# Patient Record
Sex: Male | Born: 2000 | Hispanic: No | Marital: Single | Smoking: Never smoker
Health system: Southern US, Community
[De-identification: ages and names within clinical notes are randomized; demographics above are authoritative.]

## PROBLEM LIST (undated history)

## (undated) HISTORY — PX: WISDOM TOOTH EXTRACTION: SHX21

---
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
## 2018-05-01 ENCOUNTER — Ambulatory Visit: Payer: Self-pay | Admitting: Family Medicine

## 2018-05-01 DIAGNOSIS — J40 Bronchitis, not specified as acute or chronic: Secondary | ICD-10-CM

## 2018-05-01 DIAGNOSIS — J329 Chronic sinusitis, unspecified: Secondary | ICD-10-CM

## 2018-05-01 MED ORDER — PSEUDOEPH-BROMPHEN-DM 30-2-10 MG/5ML PO SYRP: 10.0000 mL | ORAL_SOLUTION | Freq: Three times a day (TID) | ORAL | 0 refills | Status: AC | PRN

## 2018-05-01 MED ORDER — AZELASTINE HCL 0.1 % NA SOLN: 1.0000 | Freq: Two times a day (BID) | NASAL | 0 refills | Status: AC

## 2018-05-01 MED ORDER — AMOXICILLIN-POT CLAVULANATE 875-125 MG PO TABS: 1.0000 | ORAL_TABLET | Freq: Two times a day (BID) | ORAL | 0 refills | Status: AC

## 2018-05-01 NOTE — Progress Notes (Signed)
Samuel Dunlap is a 17 y.o. male who presents today with concerns of sinus congestion and pain after being exposed to college roommates who were both sick. He began having symptoms and productive cough.  Review of Systems  Constitutional: Negative for chills, fever and malaise/fatigue.  HENT: Negative for congestion, ear discharge, ear pain, sinus pain and sore throat.   Eyes: Negative.   Respiratory: Negative for cough, sputum production and shortness of breath.   Cardiovascular: Negative.  Negative for chest pain.  Gastrointestinal: Negative for abdominal pain, diarrhea, nausea and vomiting.  Genitourinary: Negative for dysuria, frequency, hematuria and urgency.  Musculoskeletal: Negative for myalgias.  Skin: Negative.   Neurological: Negative for headaches.  Endo/Heme/Allergies: Negative.   Psychiatric/Behavioral: Negative.     O: Vitals:   05/01/18 1354  BP: 118/78  Pulse: 64  Temp: 98.1 F (36.7 C)  SpO2: 95%     Physical Exam  Constitutional: He is oriented to person, place, and time. Vital signs are normal. He appears well-developed and well-nourished. He is active.  Non-toxic appearance. He does not have a sickly appearance.  HENT:  Head: Normocephalic.  Right Ear: Hearing, tympanic membrane, external ear and ear canal normal.  Left Ear: Hearing, tympanic membrane, external ear and ear canal normal.  Nose: Rhinorrhea present. Right sinus exhibits maxillary sinus tenderness. Right sinus exhibits no frontal sinus tenderness. Left sinus exhibits maxillary sinus tenderness. Left sinus exhibits no frontal sinus tenderness.  Mouth/Throat: Uvula is midline and oropharynx is clear and moist.  Neck: Normal range of motion. Neck supple.  Cardiovascular: Normal rate, regular rhythm, normal heart sounds and normal pulses.  Pulmonary/Chest: Effort normal. He has rales in the right upper field, the right middle field, the left upper field and the left middle field.  Abdominal:  Soft. Bowel sounds are normal.  Musculoskeletal: Normal range of motion.  Lymphadenopathy:       Head (right side): No submental and no submandibular adenopathy present.       Head (left side): No submental and no submandibular adenopathy present.    He has no cervical adenopathy.  Neurological: He is alert and oriented to person, place, and time.  Psychiatric: He has a normal mood and affect.  Vitals reviewed.  A: 1. Bronchitis    P: Discussed exam findings, diagnosis etiology and medication use and indications reviewed with patient. Follow- Up and discharge instructions provided. No emergent/urgent issues found on exam.  Patient verbalized understanding of information provided and agrees with plan of care (POC), all questions answered.  1. Bronchitis - amoxicillin-clavulanate (AUGMENTIN) 875-125 MG tablet; Take 1 tablet by mouth 2 (two) times daily for 7 days. - brompheniramine-pseudoephedrine-DM 30-2-10 MG/5ML syrup; Take 10 mLs by mouth 3 (three) times daily as needed. - azelastine (ASTELIN) 0.1 % nasal spray; Place 1 spray into both nostrils 2 (two) times daily. Use in each nostril as directed

## 2018-05-01 NOTE — Patient Instructions (Signed)
Acute Bronchitis, Adult Acute bronchitis is when air tubes (bronchi) in the lungs suddenly get swollen. The condition can make it hard to breathe. It can also cause these symptoms:  A cough.  Coughing up clear, yellow, or green mucus.  Wheezing.  Chest congestion.  Shortness of breath.  A fever.  Body aches.  Chills.  A sore throat.  Follow these instructions at home: Medicines  Take over-the-counter and prescription medicines only as told by your doctor.  If you were prescribed an antibiotic medicine, take it as told by your doctor. Do not stop taking the antibiotic even if you start to feel better. General instructions  Rest.  Drink enough fluids to keep your pee (urine) clear or pale yellow.  Avoid smoking and secondhand smoke. If you smoke and you need help quitting, ask your doctor. Quitting will help your lungs heal faster.  Use an inhaler, cool mist vaporizer, or humidifier as told by your doctor.  Keep all follow-up visits as told by your doctor. This is important. How is this prevented? To lower your risk of getting this condition again:  Wash your hands often with soap and water. If you cannot use soap and water, use hand sanitizer.  Avoid contact with people who have cold symptoms.  Try not to touch your hands to your mouth, nose, or eyes.  Make sure to get the flu shot every year.  Contact a doctor if:  Your symptoms do not get better in 2 weeks. Get help right away if:  You cough up blood.  You have chest pain.  You have very bad shortness of breath.  You become dehydrated.  You faint (pass out) or keep feeling like you are going to pass out.  You keep throwing up (vomiting).  You have a very bad headache.  Your fever or chills gets worse. This information is not intended to replace advice given to you by your health care provider. Make sure you discuss any questions you have with your health care provider. Document Released:  12/16/2007 Document Revised: 02/05/2016 Document Reviewed: 12/18/2015 Elsevier Interactive Patient Education  2018 [employer]. Food Choices to Help Relieve Diarrhea, Adult When you have diarrhea, the foods you eat and your eating habits are very important. Choosing the right foods and drinks can help:  Relieve diarrhea.  Replace lost fluids and nutrients.  Prevent dehydration.  What general guidelines should I follow? Relieving diarrhea  Choose foods with less than 2 g or .07 oz. of fiber per serving.  Limit fats to less than 8 tsp (38 g or 1.34 oz.) a day.  Avoid the following: ? Foods and beverages sweetened with high-fructose corn syrup, honey, or sugar alcohols such as xylitol, sorbitol, and mannitol. ? Foods that contain a lot of fat or sugar. ? Fried, greasy, or spicy foods. ? High-fiber grains, breads, and cereals. ? Raw fruits and vegetables.  Eat foods that are rich in probiotics. These foods include dairy products such as yogurt and fermented milk products. They help increase healthy bacteria in the stomach and intestines (gastrointestinal tract, or GI tract).  If you have lactose intolerance, avoid dairy products. These may make your diarrhea worse.  Take medicine to help stop diarrhea (antidiarrheal medicine) only as told by your health care provider. Replacing nutrients  Eat small meals or snacks every 3-4 hours.  Eat bland foods, such as white rice, toast, or baked potato, until your diarrhea starts to get better. Gradually reintroduce nutrient-rich foods as tolerated or  as told by your health care provider. This includes: ? Well-cooked protein foods. ? Peeled, seeded, and soft-cooked fruits and vegetables. ? Low-fat dairy products.  Take vitamin and mineral supplements as told by your health care provider. Preventing dehydration   Start by sipping water or a special solution to prevent dehydration (oral rehydration solution, ORS). Urine that is clear or  pale yellow means that you are getting enough fluid.  Try to drink at least 8-10 cups of fluid each day to help replace lost fluids.  You may add other liquids in addition to water, such as clear juice or decaffeinated sports drinks, as tolerated or as told by your health care provider.  Avoid drinks with caffeine, such as coffee, tea, or soft drinks.  Avoid alcohol. What foods are recommended? The items listed may not be a complete list. Talk with your health care provider about what dietary choices are best for you. Grains White rice. White, [place], or pita breads (fresh or toasted), including plain rolls, buns, or bagels. White pasta. Saltine, soda, or Zidan Helget crackers. Pretzels. Low-fiber cereal. Cooked cereals made with water (such as cornmeal, farina, or cream cereals). Plain muffins. Matzo. Melba toast. Zwieback. Vegetables Potatoes (without the skin). Most well-cooked and canned vegetables without skins or seeds. Tender lettuce. Fruits Apple sauce. Fruits canned in juice. Cooked apricots, cherries, grapefruit, peaches, pears, or plums. Fresh bananas and cantaloupe. Meats and other protein foods Baked or boiled chicken. Eggs. Tofu. Fish. Seafood. Smooth nut butters. Ground or well-cooked tender beef, ham, veal, lamb, pork, or poultry. Dairy Plain yogurt, kefir, and unsweetened liquid yogurt. Lactose-free milk, buttermilk, skim milk, or soy milk. Low-fat or nonfat hard cheese. Beverages Water. Low-calorie sports drinks. Fruit juices without pulp. Strained tomato and vegetable juices. Decaffeinated teas. Sugar-free beverages not sweetened with sugar alcohols. Oral rehydration solutions, if approved by your health care provider. Seasoning and other foods Bouillon, broth, or soups made from recommended foods. What foods are not recommended? The items listed may not be a complete list. Talk with your health care provider about what dietary choices are best for you. Grains Whole grain,  whole wheat, bran, or rye breads, rolls, pastas, and crackers. Wild or brown rice. Whole grain or bran cereals. Barley. Oats and oatmeal. Corn tortillas or taco shells. Granola. Popcorn. Vegetables Raw vegetables. Fried vegetables. Cabbage, broccoli, Brussels sprouts, artichokes, baked beans, beet greens, corn, kale, legumes, peas, sweet potatoes, and yams. Potato skins. Cooked spinach and cabbage. Fruits Dried fruit, including raisins and dates. Raw fruits. Stewed or dried prunes. Canned fruits with syrup. Meat and other protein foods Fried or fatty meats. Deli meats. Chunky nut butters. Nuts and seeds. Beans and lentils. Tomasa Blase. Hot dogs. Sausage. Dairy High-fat cheeses. Whole milk, chocolate milk, and beverages made with milk, such as milk shakes. Half-and-half. Cream. sour cream. Ice cream. Beverages Caffeinated beverages (such as coffee, tea, soda, or energy drinks). Alcoholic beverages. Fruit juices with pulp. Prune juice. Soft drinks sweetened with high-fructose corn syrup or sugar alcohols. High-calorie sports drinks. Fats and oils Butter. Cream sauces. Margarine. Salad oils. Plain salad dressings. Olives. Avocados. Mayonnaise. Sweets and desserts Sweet rolls, doughnuts, and sweet breads. Sugar-free desserts sweetened with sugar alcohols such as xylitol and sorbitol. Seasoning and other foods Honey. Hot sauce. Chili powder. Gravy. Cream-based or milk-based soups. Pancakes and waffles. Summary  When you have diarrhea, the foods you eat and your eating habits are very important.  Make sure you get at least 8-10 cups of fluid each day, or  enough to keep your urine clear or pale yellow.  Eat bland foods and gradually reintroduce healthy, nutrient-rich foods as tolerated, or as told by your health care provider.  Avoid high-fiber, fried, greasy, or spicy foods. This information is not intended to replace advice given to you by your health care provider. Make sure you discuss any questions  you have with your health care provider. Document Released: 09/19/2003 Document Revised: 06/26/2016 Document Reviewed: 06/26/2016 Elsevier Interactive Patient Education  [employer].

## 2018-07-22 DIAGNOSIS — S99911A Unspecified injury of right ankle, initial encounter: Secondary | ICD-10-CM | POA: Diagnosis not present

## 2018-12-23 DIAGNOSIS — Z20828 Contact with and (suspected) exposure to other viral communicable diseases: Secondary | ICD-10-CM | POA: Diagnosis not present

## 2019-09-21 DIAGNOSIS — Z20828 Contact with and (suspected) exposure to other viral communicable diseases: Secondary | ICD-10-CM | POA: Diagnosis not present

## 2019-09-22 DIAGNOSIS — Z03818 Encounter for observation for suspected exposure to other biological agents ruled out: Secondary | ICD-10-CM | POA: Diagnosis not present

## 2019-09-22 DIAGNOSIS — Z20828 Contact with and (suspected) exposure to other viral communicable diseases: Secondary | ICD-10-CM | POA: Diagnosis not present

## 2019-09-24 DIAGNOSIS — Z23 Encounter for immunization: Secondary | ICD-10-CM | POA: Diagnosis not present

## 2019-10-22 DIAGNOSIS — Z23 Encounter for immunization: Secondary | ICD-10-CM | POA: Diagnosis not present

## 2020-01-01 DIAGNOSIS — H5213 Myopia, bilateral: Secondary | ICD-10-CM | POA: Diagnosis not present

## 2020-01-26 DIAGNOSIS — S8991XA Unspecified injury of right lower leg, initial encounter: Secondary | ICD-10-CM | POA: Diagnosis not present

## 2020-01-26 DIAGNOSIS — S83411A Sprain of medial collateral ligament of right knee, initial encounter: Secondary | ICD-10-CM | POA: Diagnosis not present

## 2020-01-26 DIAGNOSIS — S83421A Sprain of lateral collateral ligament of right knee, initial encounter: Secondary | ICD-10-CM | POA: Diagnosis not present

## 2020-02-20 DIAGNOSIS — S8991XA Unspecified injury of right lower leg, initial encounter: Secondary | ICD-10-CM | POA: Diagnosis not present

## 2020-02-23 DIAGNOSIS — S8991XA Unspecified injury of right lower leg, initial encounter: Secondary | ICD-10-CM | POA: Diagnosis not present

## 2020-02-27 DIAGNOSIS — S8991XA Unspecified injury of right lower leg, initial encounter: Secondary | ICD-10-CM | POA: Diagnosis not present

## 2020-03-05 DIAGNOSIS — S8991XA Unspecified injury of right lower leg, initial encounter: Secondary | ICD-10-CM | POA: Diagnosis not present

## 2020-03-12 DIAGNOSIS — S8991XA Unspecified injury of right lower leg, initial encounter: Secondary | ICD-10-CM | POA: Diagnosis not present

## 2020-03-15 DIAGNOSIS — J029 Acute pharyngitis, unspecified: Secondary | ICD-10-CM | POA: Diagnosis not present

## 2020-03-15 DIAGNOSIS — S8991XA Unspecified injury of right lower leg, initial encounter: Secondary | ICD-10-CM | POA: Diagnosis not present

## 2020-03-20 DIAGNOSIS — S8991XA Unspecified injury of right lower leg, initial encounter: Secondary | ICD-10-CM | POA: Diagnosis not present

## 2020-03-21 DIAGNOSIS — S83411A Sprain of medial collateral ligament of right knee, initial encounter: Secondary | ICD-10-CM | POA: Diagnosis not present

## 2020-03-21 DIAGNOSIS — S83241A Other tear of medial meniscus, current injury, right knee, initial encounter: Secondary | ICD-10-CM | POA: Diagnosis not present

## 2020-03-21 DIAGNOSIS — S83511A Sprain of anterior cruciate ligament of right knee, initial encounter: Secondary | ICD-10-CM | POA: Diagnosis not present

## 2020-04-14 DIAGNOSIS — Z03818 Encounter for observation for suspected exposure to other biological agents ruled out: Secondary | ICD-10-CM | POA: Diagnosis not present

## 2020-05-08 DIAGNOSIS — S83511D Sprain of anterior cruciate ligament of right knee, subsequent encounter: Secondary | ICD-10-CM | POA: Diagnosis not present

## 2020-06-21 DIAGNOSIS — Z20822 Contact with and (suspected) exposure to covid-19: Secondary | ICD-10-CM | POA: Diagnosis not present

## 2020-06-21 DIAGNOSIS — Z01812 Encounter for preprocedural laboratory examination: Secondary | ICD-10-CM | POA: Diagnosis not present

## 2020-06-24 DIAGNOSIS — Z6371 Stress on family due to return of family member from military deployment: Secondary | ICD-10-CM | POA: Diagnosis not present

## 2020-06-24 DIAGNOSIS — S83281A Other tear of lateral meniscus, current injury, right knee, initial encounter: Secondary | ICD-10-CM | POA: Diagnosis not present

## 2020-06-24 DIAGNOSIS — Z7409 Other reduced mobility: Secondary | ICD-10-CM | POA: Diagnosis not present

## 2020-06-24 DIAGNOSIS — S83511A Sprain of anterior cruciate ligament of right knee, initial encounter: Secondary | ICD-10-CM | POA: Diagnosis not present

## 2020-06-24 DIAGNOSIS — S83241A Other tear of medial meniscus, current injury, right knee, initial encounter: Secondary | ICD-10-CM | POA: Diagnosis not present

## 2020-06-28 DIAGNOSIS — S83511D Sprain of anterior cruciate ligament of right knee, subsequent encounter: Secondary | ICD-10-CM | POA: Diagnosis not present

## 2020-06-28 DIAGNOSIS — M25661 Stiffness of right knee, not elsewhere classified: Secondary | ICD-10-CM | POA: Diagnosis not present

## 2020-06-28 DIAGNOSIS — M6281 Muscle weakness (generalized): Secondary | ICD-10-CM | POA: Diagnosis not present

## 2020-07-02 DIAGNOSIS — M25661 Stiffness of right knee, not elsewhere classified: Secondary | ICD-10-CM | POA: Diagnosis not present

## 2020-07-02 DIAGNOSIS — S83511D Sprain of anterior cruciate ligament of right knee, subsequent encounter: Secondary | ICD-10-CM | POA: Diagnosis not present

## 2020-07-02 DIAGNOSIS — M6281 Muscle weakness (generalized): Secondary | ICD-10-CM | POA: Diagnosis not present

## 2020-07-04 DIAGNOSIS — S83511D Sprain of anterior cruciate ligament of right knee, subsequent encounter: Secondary | ICD-10-CM | POA: Diagnosis not present

## 2020-07-04 DIAGNOSIS — M25661 Stiffness of right knee, not elsewhere classified: Secondary | ICD-10-CM | POA: Diagnosis not present

## 2020-07-04 DIAGNOSIS — M6281 Muscle weakness (generalized): Secondary | ICD-10-CM | POA: Diagnosis not present

## 2020-07-09 DIAGNOSIS — M25561 Pain in right knee: Secondary | ICD-10-CM | POA: Diagnosis not present

## 2020-07-09 DIAGNOSIS — S83511A Sprain of anterior cruciate ligament of right knee, initial encounter: Secondary | ICD-10-CM | POA: Diagnosis not present

## 2020-07-09 DIAGNOSIS — M6281 Muscle weakness (generalized): Secondary | ICD-10-CM | POA: Diagnosis not present

## 2020-07-09 DIAGNOSIS — S83511D Sprain of anterior cruciate ligament of right knee, subsequent encounter: Secondary | ICD-10-CM | POA: Diagnosis not present

## 2020-07-09 DIAGNOSIS — S83241A Other tear of medial meniscus, current injury, right knee, initial encounter: Secondary | ICD-10-CM | POA: Diagnosis not present

## 2020-07-09 DIAGNOSIS — M25661 Stiffness of right knee, not elsewhere classified: Secondary | ICD-10-CM | POA: Diagnosis not present

## 2020-07-11 DIAGNOSIS — S83511D Sprain of anterior cruciate ligament of right knee, subsequent encounter: Secondary | ICD-10-CM | POA: Diagnosis not present

## 2020-07-11 DIAGNOSIS — M25661 Stiffness of right knee, not elsewhere classified: Secondary | ICD-10-CM | POA: Diagnosis not present

## 2020-07-11 DIAGNOSIS — M6281 Muscle weakness (generalized): Secondary | ICD-10-CM | POA: Diagnosis not present

## 2020-07-22 DIAGNOSIS — S83511A Sprain of anterior cruciate ligament of right knee, initial encounter: Secondary | ICD-10-CM | POA: Diagnosis not present

## 2020-07-22 DIAGNOSIS — S83241A Other tear of medial meniscus, current injury, right knee, initial encounter: Secondary | ICD-10-CM | POA: Diagnosis not present

## 2020-07-25 DIAGNOSIS — S83511D Sprain of anterior cruciate ligament of right knee, subsequent encounter: Secondary | ICD-10-CM | POA: Diagnosis not present

## 2020-07-25 DIAGNOSIS — S83241A Other tear of medial meniscus, current injury, right knee, initial encounter: Secondary | ICD-10-CM | POA: Diagnosis not present

## 2020-07-30 DIAGNOSIS — S83249A Other tear of medial meniscus, current injury, unspecified knee, initial encounter: Secondary | ICD-10-CM | POA: Diagnosis not present

## 2020-07-30 DIAGNOSIS — S83511D Sprain of anterior cruciate ligament of right knee, subsequent encounter: Secondary | ICD-10-CM | POA: Diagnosis not present

## 2020-08-01 DIAGNOSIS — S83511D Sprain of anterior cruciate ligament of right knee, subsequent encounter: Secondary | ICD-10-CM | POA: Diagnosis not present

## 2020-08-05 DIAGNOSIS — S83249A Other tear of medial meniscus, current injury, unspecified knee, initial encounter: Secondary | ICD-10-CM | POA: Diagnosis not present

## 2020-08-05 DIAGNOSIS — S83511D Sprain of anterior cruciate ligament of right knee, subsequent encounter: Secondary | ICD-10-CM | POA: Diagnosis not present

## 2020-08-08 DIAGNOSIS — S83241A Other tear of medial meniscus, current injury, right knee, initial encounter: Secondary | ICD-10-CM | POA: Diagnosis not present

## 2020-08-08 DIAGNOSIS — S83511D Sprain of anterior cruciate ligament of right knee, subsequent encounter: Secondary | ICD-10-CM | POA: Diagnosis not present

## 2020-08-12 DIAGNOSIS — S83511D Sprain of anterior cruciate ligament of right knee, subsequent encounter: Secondary | ICD-10-CM | POA: Diagnosis not present

## 2020-08-12 DIAGNOSIS — S83249A Other tear of medial meniscus, current injury, unspecified knee, initial encounter: Secondary | ICD-10-CM | POA: Diagnosis not present

## 2020-08-15 DIAGNOSIS — S83511D Sprain of anterior cruciate ligament of right knee, subsequent encounter: Secondary | ICD-10-CM | POA: Diagnosis not present

## 2020-08-15 DIAGNOSIS — S83241A Other tear of medial meniscus, current injury, right knee, initial encounter: Secondary | ICD-10-CM | POA: Diagnosis not present

## 2020-08-19 DIAGNOSIS — S83241A Other tear of medial meniscus, current injury, right knee, initial encounter: Secondary | ICD-10-CM | POA: Diagnosis not present

## 2020-08-19 DIAGNOSIS — S83511D Sprain of anterior cruciate ligament of right knee, subsequent encounter: Secondary | ICD-10-CM | POA: Diagnosis not present

## 2020-08-22 DIAGNOSIS — S83241A Other tear of medial meniscus, current injury, right knee, initial encounter: Secondary | ICD-10-CM | POA: Diagnosis not present

## 2020-08-22 DIAGNOSIS — S83511D Sprain of anterior cruciate ligament of right knee, subsequent encounter: Secondary | ICD-10-CM | POA: Diagnosis not present

## 2020-08-26 DIAGNOSIS — S83511D Sprain of anterior cruciate ligament of right knee, subsequent encounter: Secondary | ICD-10-CM | POA: Diagnosis not present

## 2020-08-26 DIAGNOSIS — S83241A Other tear of medial meniscus, current injury, right knee, initial encounter: Secondary | ICD-10-CM | POA: Diagnosis not present

## 2020-08-29 DIAGNOSIS — S83511D Sprain of anterior cruciate ligament of right knee, subsequent encounter: Secondary | ICD-10-CM | POA: Diagnosis not present

## 2020-08-29 DIAGNOSIS — S83241A Other tear of medial meniscus, current injury, right knee, initial encounter: Secondary | ICD-10-CM | POA: Diagnosis not present

## 2020-09-02 DIAGNOSIS — S83241A Other tear of medial meniscus, current injury, right knee, initial encounter: Secondary | ICD-10-CM | POA: Diagnosis not present

## 2020-09-02 DIAGNOSIS — S83511D Sprain of anterior cruciate ligament of right knee, subsequent encounter: Secondary | ICD-10-CM | POA: Diagnosis not present

## 2020-09-09 DIAGNOSIS — S83511D Sprain of anterior cruciate ligament of right knee, subsequent encounter: Secondary | ICD-10-CM | POA: Diagnosis not present

## 2020-09-09 DIAGNOSIS — S83241A Other tear of medial meniscus, current injury, right knee, initial encounter: Secondary | ICD-10-CM | POA: Diagnosis not present

## 2020-09-16 DIAGNOSIS — S83511D Sprain of anterior cruciate ligament of right knee, subsequent encounter: Secondary | ICD-10-CM | POA: Diagnosis not present

## 2020-09-16 DIAGNOSIS — S83241A Other tear of medial meniscus, current injury, right knee, initial encounter: Secondary | ICD-10-CM | POA: Diagnosis not present

## 2020-09-30 DIAGNOSIS — S83241A Other tear of medial meniscus, current injury, right knee, initial encounter: Secondary | ICD-10-CM | POA: Diagnosis not present

## 2020-09-30 DIAGNOSIS — S83511D Sprain of anterior cruciate ligament of right knee, subsequent encounter: Secondary | ICD-10-CM | POA: Diagnosis not present

## 2020-10-21 DIAGNOSIS — S83511D Sprain of anterior cruciate ligament of right knee, subsequent encounter: Secondary | ICD-10-CM | POA: Diagnosis not present

## 2020-10-21 DIAGNOSIS — S83241A Other tear of medial meniscus, current injury, right knee, initial encounter: Secondary | ICD-10-CM | POA: Diagnosis not present

## 2020-10-24 DIAGNOSIS — S8991XA Unspecified injury of right lower leg, initial encounter: Secondary | ICD-10-CM | POA: Diagnosis not present

## 2020-10-28 DIAGNOSIS — S83511D Sprain of anterior cruciate ligament of right knee, subsequent encounter: Secondary | ICD-10-CM | POA: Diagnosis not present

## 2020-10-28 DIAGNOSIS — S83241A Other tear of medial meniscus, current injury, right knee, initial encounter: Secondary | ICD-10-CM | POA: Diagnosis not present

## 2020-11-04 DIAGNOSIS — S83241A Other tear of medial meniscus, current injury, right knee, initial encounter: Secondary | ICD-10-CM | POA: Diagnosis not present

## 2020-11-04 DIAGNOSIS — S83511D Sprain of anterior cruciate ligament of right knee, subsequent encounter: Secondary | ICD-10-CM | POA: Diagnosis not present

## 2020-11-21 DIAGNOSIS — S83511D Sprain of anterior cruciate ligament of right knee, subsequent encounter: Secondary | ICD-10-CM | POA: Diagnosis not present

## 2020-11-21 DIAGNOSIS — S83241A Other tear of medial meniscus, current injury, right knee, initial encounter: Secondary | ICD-10-CM | POA: Diagnosis not present

## 2020-11-29 DIAGNOSIS — S83241A Other tear of medial meniscus, current injury, right knee, initial encounter: Secondary | ICD-10-CM | POA: Diagnosis not present

## 2020-11-29 DIAGNOSIS — S83511D Sprain of anterior cruciate ligament of right knee, subsequent encounter: Secondary | ICD-10-CM | POA: Diagnosis not present

## 2020-12-05 DIAGNOSIS — S83241A Other tear of medial meniscus, current injury, right knee, initial encounter: Secondary | ICD-10-CM | POA: Diagnosis not present

## 2020-12-05 DIAGNOSIS — S83511D Sprain of anterior cruciate ligament of right knee, subsequent encounter: Secondary | ICD-10-CM | POA: Diagnosis not present

## 2020-12-12 DIAGNOSIS — S83511D Sprain of anterior cruciate ligament of right knee, subsequent encounter: Secondary | ICD-10-CM | POA: Diagnosis not present

## 2020-12-12 DIAGNOSIS — S83241A Other tear of medial meniscus, current injury, right knee, initial encounter: Secondary | ICD-10-CM | POA: Diagnosis not present

## 2020-12-19 DIAGNOSIS — S83241A Other tear of medial meniscus, current injury, right knee, initial encounter: Secondary | ICD-10-CM | POA: Diagnosis not present

## 2020-12-19 DIAGNOSIS — S83511D Sprain of anterior cruciate ligament of right knee, subsequent encounter: Secondary | ICD-10-CM | POA: Diagnosis not present

## 2020-12-26 DIAGNOSIS — S83241A Other tear of medial meniscus, current injury, right knee, initial encounter: Secondary | ICD-10-CM | POA: Diagnosis not present

## 2020-12-26 DIAGNOSIS — S83511D Sprain of anterior cruciate ligament of right knee, subsequent encounter: Secondary | ICD-10-CM | POA: Diagnosis not present

## 2021-01-01 DIAGNOSIS — S83511D Sprain of anterior cruciate ligament of right knee, subsequent encounter: Secondary | ICD-10-CM | POA: Diagnosis not present

## 2021-01-01 DIAGNOSIS — S83241A Other tear of medial meniscus, current injury, right knee, initial encounter: Secondary | ICD-10-CM | POA: Diagnosis not present

## 2021-01-08 DIAGNOSIS — S83511D Sprain of anterior cruciate ligament of right knee, subsequent encounter: Secondary | ICD-10-CM | POA: Diagnosis not present

## 2021-01-08 DIAGNOSIS — S83249A Other tear of medial meniscus, current injury, unspecified knee, initial encounter: Secondary | ICD-10-CM | POA: Diagnosis not present

## 2021-01-23 DIAGNOSIS — S8991XA Unspecified injury of right lower leg, initial encounter: Secondary | ICD-10-CM | POA: Diagnosis not present

## 2021-01-27 DIAGNOSIS — S83511D Sprain of anterior cruciate ligament of right knee, subsequent encounter: Secondary | ICD-10-CM | POA: Diagnosis not present

## 2021-01-27 DIAGNOSIS — S83249A Other tear of medial meniscus, current injury, unspecified knee, initial encounter: Secondary | ICD-10-CM | POA: Diagnosis not present

## 2021-02-03 DIAGNOSIS — S83511D Sprain of anterior cruciate ligament of right knee, subsequent encounter: Secondary | ICD-10-CM | POA: Diagnosis not present

## 2021-02-03 DIAGNOSIS — S83249A Other tear of medial meniscus, current injury, unspecified knee, initial encounter: Secondary | ICD-10-CM | POA: Diagnosis not present

## 2021-06-12 DIAGNOSIS — R509 Fever, unspecified: Secondary | ICD-10-CM | POA: Diagnosis not present
